# Patient Record
Sex: Female | Born: 1970
Health system: Southern US, Community
[De-identification: ages and names within clinical notes are randomized; demographics above are authoritative.]

---

## 2012-06-05 ENCOUNTER — Other Ambulatory Visit (HOSPITAL_COMMUNITY)
Admission: RE | Admit: 2012-06-05 | Discharge: 2012-06-05 | Disposition: A | Discharge status: Home / Self Care | Payer: 59 | Source: Ambulatory Visit | Attending: Obstetrics & Gynecology | Admitting: Obstetrics & Gynecology

## 2012-06-05 DIAGNOSIS — Z113 Encounter for screening for infections with a predominantly sexual mode of transmission: Secondary | ICD-10-CM | POA: Insufficient documentation

## 2012-06-05 DIAGNOSIS — Z124 Encounter for screening for malignant neoplasm of cervix: Secondary | ICD-10-CM | POA: Insufficient documentation

## 2012-06-05 DIAGNOSIS — Z1151 Encounter for screening for human papillomavirus (HPV): Secondary | ICD-10-CM | POA: Insufficient documentation

## 2017-08-21 ENCOUNTER — Encounter: Payer: Self-pay | Admitting: Podiatry

## 2017-08-21 ENCOUNTER — Ambulatory Visit (INDEPENDENT_AMBULATORY_CARE_PROVIDER_SITE_OTHER): Payer: 59 | Admitting: Podiatry

## 2017-08-21 VITALS — BP 143/94 | HR 80 | Ht 62.0 in | Wt 125.0 lb

## 2017-08-21 DIAGNOSIS — M6701 Short Achilles tendon (acquired), right ankle: Secondary | ICD-10-CM

## 2017-08-21 DIAGNOSIS — M722 Plantar fascial fibromatosis: Secondary | ICD-10-CM | POA: Diagnosis not present

## 2017-08-21 DIAGNOSIS — M21969 Unspecified acquired deformity of unspecified lower leg: Secondary | ICD-10-CM

## 2017-08-21 DIAGNOSIS — M79672 Pain in left foot: Secondary | ICD-10-CM | POA: Diagnosis not present

## 2017-08-21 NOTE — Patient Instructions (Signed)
Seen for pain in right heel. Noted of weak first metatarsal bone that is lowering the arch height. Reviewed findings and available treatment options. Both feet casted for orthotics. Return if needed injection.

## 2017-08-21 NOTE — Progress Notes (Signed)
SUBJECTIVE: 46 y.o. year old female presents complaining of left heel pain that started about a year ago. Was taking dance class and had worn uncomfortable shoes prior to developing this pain. 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  Review of Systems  Constitutional: Negative.   HENT: Negative.   Eyes: Negative.   Respiratory: Negative.   Cardiovascular: Negative.   Gastrointestinal: Negative.   Genitourinary: Negative.   Musculoskeletal: Negative.   Skin: Negative.   Neurological: Negative.   Psychiatric/Behavioral: Negative.    OBJECTIVE: DERMATOLOGIC EXAMINATION: No abnormal findings.  VASCULAR EXAMINATION OF LOWER LIMBS: All pedal pulses are palpable with normal pulsation.  Temperature gradient from tibial crest to dorsum of foot is within normal bilateral.  NEUROLOGIC EXAMINATION OF THE LOWER LIMBS: All epicritic and tactile sensations grossly intact. Sharp and Dull discriminatory sensations at the plantar ball of hallux is intact bilateral.   MUSCULOSKELETAL EXAMINATION: Positive for elevated first ray bilateral with dorsal bunion on right foot. Pain in right plantar at about medial calcaneal tuberosity area. Tight Achilles tendon right.  Radiology: Long first metatarsal on right. Rectus foot with digital contracture 4th and 5th bilateral R>L. Elevated first ray bilateral with good arch formation.  Dorsal spur at the head of the first metatarsal R>L. No calcaneal spur noted.  ASSESSMENT: Plantar fasciitis left. Metatarsus primus elevatus bilateral. Dorsal bunion right. Achilles tendon contracture right.  PLAN: Reviewed findings and available treatment options, NSAIA, injection, stretch exercise, orthotics, proper shoe gear. Both feet casted for orthotics. Reviewed stretch exercise for right Achilles tendon to practice daily. Return if needed injection.

## 2017-10-28 ENCOUNTER — Ambulatory Visit: Payer: 59 | Admitting: Podiatry

## 2017-10-28 ENCOUNTER — Encounter: Payer: Self-pay | Admitting: Podiatry

## 2017-10-28 DIAGNOSIS — M722 Plantar fascial fibromatosis: Secondary | ICD-10-CM

## 2017-10-28 NOTE — Progress Notes (Signed)
1 month follow up on left heel pain. Still having pain at plantar lateral heel. Inserts are helping. Making her to walk better. But the pain is still present. Different pain now.  May further benefit from lace up tennis shoes, raised up heel, metatarsal binder, Advils, cortisone injection, and also possible surgical options. Metatarsal binder dispensed with instruction. Patient will take 400 mg Ibuprofen and taper down weekly. Return as needed.

## 2017-10-28 NOTE — Patient Instructions (Addendum)
1 month follow up on left heel pain. Still having pain at plantar lateral heel. May further benefit from lace up tennis shoes, raised up heel, metatarsal binder, Advils, cortisone injection, and also possible surgical options. Return as needed.

## 2019-05-08 DIAGNOSIS — N939 Abnormal uterine and vaginal bleeding, unspecified: Secondary | ICD-10-CM | POA: Diagnosis not present

## 2019-05-21 DIAGNOSIS — N939 Abnormal uterine and vaginal bleeding, unspecified: Secondary | ICD-10-CM | POA: Diagnosis not present

## 2019-07-03 DIAGNOSIS — Z209 Contact with and (suspected) exposure to unspecified communicable disease: Secondary | ICD-10-CM | POA: Diagnosis not present

## 2019-07-03 DIAGNOSIS — N939 Abnormal uterine and vaginal bleeding, unspecified: Secondary | ICD-10-CM | POA: Diagnosis not present

## 2019-07-03 DIAGNOSIS — N840 Polyp of corpus uteri: Secondary | ICD-10-CM | POA: Diagnosis not present

## 2019-11-02 DIAGNOSIS — Z6827 Body mass index (BMI) 27.0-27.9, adult: Secondary | ICD-10-CM | POA: Diagnosis not present

## 2019-11-02 DIAGNOSIS — Z01419 Encounter for gynecological examination (general) (routine) without abnormal findings: Secondary | ICD-10-CM | POA: Diagnosis not present

## 2019-11-02 DIAGNOSIS — Z1231 Encounter for screening mammogram for malignant neoplasm of breast: Secondary | ICD-10-CM | POA: Diagnosis not present

## 2019-12-03 DIAGNOSIS — K635 Polyp of colon: Secondary | ICD-10-CM | POA: Diagnosis not present

## 2019-12-03 DIAGNOSIS — D125 Benign neoplasm of sigmoid colon: Secondary | ICD-10-CM | POA: Diagnosis not present

## 2019-12-03 DIAGNOSIS — Z1211 Encounter for screening for malignant neoplasm of colon: Secondary | ICD-10-CM | POA: Diagnosis not present

## 2020-05-25 ENCOUNTER — Ambulatory Visit (INDEPENDENT_AMBULATORY_CARE_PROVIDER_SITE_OTHER): Payer: 59

## 2020-05-25 ENCOUNTER — Encounter: Payer: Self-pay | Admitting: Family Medicine

## 2020-05-25 ENCOUNTER — Other Ambulatory Visit: Payer: Self-pay

## 2020-05-25 ENCOUNTER — Ambulatory Visit: Payer: Self-pay

## 2020-05-25 ENCOUNTER — Ambulatory Visit: Payer: 59 | Admitting: Family Medicine

## 2020-05-25 VITALS — BP 124/82 | HR 81 | Ht 62.0 in | Wt 148.0 lb

## 2020-05-25 DIAGNOSIS — M75111 Incomplete rotator cuff tear or rupture of right shoulder, not specified as traumatic: Secondary | ICD-10-CM | POA: Diagnosis not present

## 2020-05-25 DIAGNOSIS — M79601 Pain in right arm: Secondary | ICD-10-CM

## 2020-05-25 DIAGNOSIS — M25511 Pain in right shoulder: Secondary | ICD-10-CM | POA: Diagnosis not present

## 2020-05-25 MED ORDER — NITROGLYCERIN 0.2 MG/HR TD PT24
MEDICATED_PATCH | TRANSDERMAL | 1 refills | Status: DC
Start: 2020-05-25 — End: 2022-06-13

## 2020-05-25 NOTE — Patient Instructions (Signed)
Thank you for coming in today. I think you have a partial rotator cuff tear.  Plan for PT and nitro patches.  Nitroglycerin Protocol   Apply 1/4 nitroglycerin patch to affected area daily.  Change position of patch within the affected area every 24 hours.  You may experience a headache during the first 1-2 weeks of using the patch, these should subside.  If you experience headaches after beginning nitroglycerin patch treatment, you may take your preferred over the counter pain reliever.  Another side effect of the nitroglycerin patch is skin irritation or rash related to patch adhesive.  Please notify our office if you develop more severe headaches or rash, and stop the patch.  Tendon healing with nitroglycerin patch may require 12 to 24 weeks depending on the extent of injury.  Men should not use if taking Viagra, Cialis, or Levitra.   Do not use if you have migraines or rosacea.    Recheck in 4 weeks.  Return or contact me sooner if needed.    Rotator Cuff Tear  A rotator cuff tear is a partial or complete tear of the cord-like bands (tendons) that connect muscle to bone in the rotator cuff. The rotator cuff is a group of muscles and tendons that surround the shoulder joint and keep the upper arm bone (humerus) in the shoulder socket. The tear can occur suddenly (acute tear) or can develop over a long period of time (chronic tear). What are the causes? Acute tears may be caused by:  A fall, especially on an outstretched arm.  Lifting very heavy objects with a jerking motion. Chronic tears may be caused by overuse of the muscles. This may happen in sports, physical work, or activities in which your arm repeatedly moves over your head. What increases the risk? This condition is more likely to occur in:  Athletes and workers who frequently use their shoulder or reach over their heads. This may include activities such as: ? Tennis. ? Baseball and softball. ? Swimming and  rowing. ? Weightlifting. ? Holiday representative work. ? Painting.  People who smoke.  Older people who have arthritis or poor blood supply. These can make the muscles and tendons weaker. What are the signs or symptoms? Symptoms of this condition depend on the type and severity of the injury:  An acute tear may include a sudden tearing feeling, followed by severe pain that goes from your upper shoulder, down your arm, and toward your elbow.  A chronic tear includes a gradual weakness and decreased shoulder motion as the pain gets worse. The pain is usually worse at night. Both types may have symptoms such as:  Pain that spreads (radiates) from the shoulder to the upper arm.  Swelling and tenderness in front of the shoulder.  Decreased range of motion.  Pain when: ? Reaching, pulling, or lifting the arm above the head. ? Lowering the arm from above the head.  Not being able to raise your arm out to the side.  Difficulty placing the arm behind your back. How is this diagnosed? This condition is diagnosed with a medical history and physical exam. Imaging tests may also be done, including:  X-rays.  MRI.  Ultrasound.  CT or MR arthrogram. During this test, a contrast material is injected into your shoulder and then images are taken. How is this treated? Treatment for this condition depends on the type and severity of the condition. In less severe cases, treatment may include:  Rest. This may be done with  a sling that holds the shoulder still (immobilization). Your health care provider may also recommend avoiding activities that involve lifting your arm over your head.  Icing the shoulder.  Anti-inflammatory medicines, such as aspirin or ibuprofen.  Strengthening and stretching exercises. Your health care provider may recommend specific exercises to improve your range of motion and strengthen your shoulder. In more severe cases, treatment may include:  Physical  therapy.  Steroid injections.  Surgery. Follow these instructions at home: Managing pain, stiffness, and swelling  If directed, put ice on the injured area. ? If you have a removable sling, remove it as told by your health care provider. ? Put ice in a plastic bag. ? Place a towel between your skin and the bag. ? Leave the ice on for 20 minutes, 2-3 times a day.  Raise (elevate) the injured area above the level of your heart while you are lying down.  Find a comfortable sleeping position or sleep on a recliner, if available.  Move your fingers often to avoid stiffness and to lessen swelling.  Once the swelling has gone down, your health care provider may direct you to apply heat to relax the muscles. Use the heat source that your health care provider recommends, such as a moist heat pack or a heating pad. ? Place a towel between your skin and the heat source. ? Leave the heat on for 20-30 minutes. ? Remove the heat if your skin turns bright red. This is especially important if you are unable to feel pain, heat, or cold. You may have a greater risk of getting burned. If you have a sling:  Wear the sling as told by your health care provider. Remove it only as told by your health care provider.  Loosen the sling if your fingers tingle, become numb, or turn cold and blue.  Keep the sling clean.  If the sling is not waterproof: ? Do not let it get wet. ? Cover it with a watertight covering when you take a bath or a shower. Driving  Do not drive or use heavy machinery while taking prescription pain medicine.  Ask your health care provider when it is safe to drive if you have a sling on your arm. Activity  Rest your shoulder as told by your health care provider.  Return to your normal activities as told by your health care provider. Ask your health care provider what activities are safe for you.  Do any exercises or stretches as told by your health care provider. General  instructions  Do not use any products that contain nicotine or tobacco, such as cigarettes and e-cigarettes. If you need help quitting, ask your health care provider.  Take over-the-counter and prescription medicines only as told by your health care provider.  Keep all follow-up visits as told by your health care provider. This is important. Contact a health care provider if:  Your pain gets worse.  You have new pain in your arm, hands, or fingers.  Medicine does not help your pain. Get help right away if:  Your arm, hand, or fingers are numb or tingling.  Your arm, hand, or fingers are swollen or painful or they turn white or blue.  Your hand or fingers on your injured arm are colder than your other hand. Summary  A rotator cuff tear is a partial or complete tear of the cord-like bands (tendons) that connect muscle to bone in the rotator cuff.  The tear can occur suddenly (acute tear)  or can develop over a long period of time (chronic tear).  Treatment generally includes rest, anti-inflammatory medicines, and icing. In some cases, physical therapy and steroid injections may be needed. In severe cases, surgery may be needed. This information is not intended to replace advice given to you by your health care provider. Make sure you discuss any questions you have with your health care provider. Document Revised: 09/20/2017 Document Reviewed: 12/24/2016 Elsevier Patient Education  [place].

## 2020-05-25 NOTE — Progress Notes (Signed)
Subjective:    CC: Arm pain  I, Jessica Gilmore, am serving as a Neurosurgeon for Dr. Clementeen Graham.  HPI: Pt is a 49 y/o female presenting w/ c/o R arm pain Limiting ROM unable to lift arm not sure if it is in her shoulder started a couple months ago. States pain is better from 2 months ago but not better.  She cannot recall any injury.  However she notes that her pain started after Pilates.  She notes the pain is quite severe initially with significant difficulty with overhead motion which has improved a bit.  She denies any radiating pain weakness or numbness fevers or chills.  Not tried much treatment yet.  Radiating pain: pain un upper arm Neck pain:no  UE numbness/tingling: no  UE weakness:no Aggravating factors: lifting arm  Treatments tried: nothing   Pertinent review of Systems: No fevers or chills  Relevant historical information: Plantar fasciitis   Objective:    Vitals:   05/25/20 1547  BP: 124/82  Pulse: 81  SpO2: 97%   General: Well Developed, well nourished, and in no acute distress.   MSK: C-spine normal-appearing normal motion. Right shoulder normal-appearing Normal motion pain with abduction. Strength 4/5 abduction 5/5 external and internal rotation. Positive empty can test. Positive Hawkins and Neer's test. Negative Yergason's and speeds test.  Pulses cap refill and sensation are intact bilateral upper extremities.  Lab and Radiology Results  X-ray images right shoulder obtained today personally and independently reviewed No acute fractures mild AC DJD. Await formal radiology review  Diagnostic Limited MSK Ultrasound of: Right shoulder Biceps tendon intact in bicipital groove.  Hypoechoic fluid structure superficial to biceps tendon proximally. Subscapularis tendon looks to be intact with hypoechoic fluid seen with biceps tendon testing distal to insertion footplate of the subscapularis. Supraspinatus tendon has hypoechoic fluid collecting mid  substance of tendon indicating linear split tear.  No retraction visible. Infraspinatus tendon normal-appearing AC joint narrowed degenerative. Impression: Mid substance incomplete tear supraspinatus tendon    Impression and Recommendations:    Assessment and Plan: 49 y.o. female with right shoulder pain due to partial-thickness supraspinatus tear.  Plan for physical therapy nitroglycerin patch protocol and recheck in about a month.Marland Kitchen  PDMP not reviewed this encounter. Orders Placed This Encounter  Procedures  . [place] LIMITED JOINT SPACE STRUCTURES UP RIGHT    Standing Status:   Future    Number of Occurrences:   1    Standing Expiration Date:   05/25/2021    Order Specific Question:   Reason for Exam (SYMPTOM  OR DIAGNOSIS REQUIRED)    Answer:   Right arm pain    Order Specific Question:   Preferred imaging location?    Answer:   Adult nurse [facility]  . DG Shoulder Right    Standing Status:   Future    Number of Occurrences:   1    Standing Expiration Date:   05/25/2021    Order Specific Question:   Reason for Exam (SYMPTOM  OR DIAGNOSIS REQUIRED)    Answer:   eval shoulder pain    Order Specific Question:   Is patient pregnant?    Answer:   No    Order Specific Question:   Preferred imaging location?    Answer:   [name]    Order Specific Question:   Radiology Contrast Protocol - do NOT remove file path    Answer:   [email or web address]  . Ambulatory referral to Physical Therapy  Referral Priority:   Routine    Referral Type:   Physical Medicine    Referral Reason:   Specialty Services Required    Requested Specialty:   Physical Therapy   Meds ordered this encounter  Medications  . nitroGLYCERIN (NITRODUR - DOSED IN MG/24 HR) 0.2 mg/hr patch    Sig: Apply 1/4 patch daily to tendon for tendonitis.    Dispense:  30 patch    Refill:  1    Discussed warning signs or symptoms. Please see discharge instructions.  Patient expresses understanding.   The above documentation has been reviewed and is accurate and complete Clementeen Graham, M.D.

## 2020-05-26 NOTE — Progress Notes (Signed)
X-ray looks pretty normal to radiology.

## 2020-06-21 ENCOUNTER — Ambulatory Visit (INDEPENDENT_AMBULATORY_CARE_PROVIDER_SITE_OTHER): Payer: 59 | Admitting: Physical Therapy

## 2020-06-21 ENCOUNTER — Other Ambulatory Visit: Payer: Self-pay

## 2020-06-21 ENCOUNTER — Encounter: Payer: Self-pay | Admitting: Physical Therapy

## 2020-06-21 DIAGNOSIS — R600 Localized edema: Secondary | ICD-10-CM

## 2020-06-21 DIAGNOSIS — M25511 Pain in right shoulder: Secondary | ICD-10-CM | POA: Diagnosis not present

## 2020-06-21 DIAGNOSIS — M6281 Muscle weakness (generalized): Secondary | ICD-10-CM | POA: Diagnosis not present

## 2020-06-21 NOTE — Patient Instructions (Signed)
Access Code: [email or web address] URL: [email or web address] Date: 06/21/2020 Prepared by: Ivery Quale  Exercises Shoulder Flexion Wall Slide with Towel - 2 x daily - 6 x weekly - 1 sets - 10 reps Standing Shoulder Abduction Slides at Wall - 2 x daily - 6 x weekly - 1 sets - 10 reps Shoulder Extension with Resistance - 1 x daily - 7 x weekly - 2 sets - 10-15 reps Standing Shoulder Row with Anchored Resistance - 1 x daily - 7 x weekly - 2 sets - 10-15 reps - 5 hold Standing Shoulder Horizontal Abduction with Resistance - 1 x daily - 7 x weekly - 2 sets - 10-15 reps - 5 hold Shoulder External Rotation and Scapular Retraction with Resistance - 1 x daily - 7 x weekly - 2 sets - 10-15 reps

## 2020-06-21 NOTE — Therapy (Addendum)
[facility] [address] [place], [place], [address] Phone: [phone]   Fax:  [phone]  Physical Therapy Evaluation/Discharge addendum PHYSICAL THERAPY DISCHARGE SUMMARY  Visits from Start of Care: 1  Current functional level related to goals / functional outcomes: See below   Remaining deficits: See below   Education / Equipment: HEP Plan: Patient agrees to discharge.  Patient goals were not met. Patient is being discharged due to the patient's request.  ?????   [place] office staff notifies PT that patient requests discharge due to feeling better. Elsie Ra, PT, DPT 06/28/20 8:28 AM     Patient Details  Name: Jessica Gilmore MRN: [id number] Date of Birth: 1971-04-12 Referring Provider (PT): Noelle Penner   Encounter Date: 06/21/2020   PT End of Session - 06/21/20 1886    Visit Number 1    Number of Visits 12    Date for PT Re-Evaluation 08/02/20    PT Start Time 0804    PT Stop Time 0840    PT Time Calculation (min) 36 min    Activity Tolerance Patient tolerated treatment well    Behavior During Therapy [facility] for tasks assessed/performed           History reviewed. No pertinent past medical history.  History reviewed. No pertinent surgical history.  There were no vitals filed for this visit.    Subjective Assessment - 06/21/20 0809    Subjective Pt is a 49 y/o female presenting w/ c/o R arm pain Limiting ROM unable to lift arm not sure if it is in her shoulder started a couple months ago. States pain is better from 2 months ago but not better.  She cannot recall any injury.  However she notes that her pain started after Pilates.  She notes the pain is quite severe initially with significant difficulty with overhead motion which has improved a bit.  She denies any radiating pain weakness or numbness fevers or chills.  Not tried much treatment yet. The shoulder is improving some but still having difficulty with reaching up or  out. No pain at rest.    Pertinent History no significant PMH    Limitations Lifting;House hold activities    Diagnostic tests Rt should XR show mild AC DJD. MSK US showsMid substance incomplete partial thickness tear supraspinatus tendon    Patient Stated Goals reduce pain, no what exercises to do to make sure it heals right    Currently in Pain? Yes    Pain Score 7     Pain Location Shoulder    Pain Orientation Right    Pain Descriptors / Indicators Sharp    Pain Type Chronic pain    Pain Radiating Towards lateral Rt arm, denies any N/T    Pain Onset More than a month ago    Pain Frequency Intermittent    Aggravating Factors  lifting and reaching out or up or overhead    Pain Relieving Factors rest              [facility] PT Assessment - 06/21/20 0001      Assessment   Medical Diagnosis Rt shoulder pain, partial supraspinatus tear    Referring Provider (PT) Tommi Rumps, E    Onset Date/Surgical Date --   4 month onset of pain   Hand Dominance Right    Next MD Visit PRN    Prior Therapy none      Precautions   Precautions None      Balance Screen  Has the patient fallen in the past 6 months No    Has the patient had a decrease in activity level because of a fear of falling?  No    Is the patient reluctant to leave their home because of a fear of falling?  No      Home Ecologist residence      Prior Function   Level of Independence Independent    Leisure pilates      Cognition   Overall Cognitive Status Within Functional Limits for tasks assessed      ROM / Strength   AROM / PROM / Strength AROM;PROM;Strength      AROM   AROM Assessment Site Shoulder    Right/Left Shoulder Right    Right Shoulder Flexion 160 Degrees    Right Shoulder ABduction --   WNL abd, but in scaption limited to 100 deg before pain   Right Shoulder Internal Rotation --   L4 behind back   Right Shoulder External Rotation --   WNL     PROM   Overall PROM Comments  WNL with good GH mobility noted      Strength   Strength Assessment Site Shoulder    Right/Left Shoulder Right;Left    Right Shoulder Flexion 4+/5    Right Shoulder ABduction 4+/5    Right Shoulder Internal Rotation 5/5    Right Shoulder External Rotation 4/5    Left Shoulder Flexion 5/5    Left Shoulder ABduction 5/5    Left Shoulder Internal Rotation 5/5    Left Shoulder External Rotation 4+/5                      Objective measurements completed on examination: See above findings.       [facility] Adult PT Treatment/Exercise - 06/21/20 0001      Modalities   Modalities Cryotherapy      Cryotherapy   Number Minutes Cryotherapy 10 Minutes    Cryotherapy Location Shoulder    Type of Cryotherapy Ice pack                  PT Education - 06/21/20 0938    Education Details HEP, POC    Person(s) Educated Patient    Methods Explanation;Demonstration;Verbal cues;Handout    Comprehension Verbalized understanding;Need further instruction               PT Long Term Goals - 06/21/20 0947      PT LONG TERM GOAL #1   Title Pt will be I and compliant with HEP.    Time 6    Period Weeks    Status New    Target Date 08/02/20      PT LONG TERM GOAL #2   Title Pt will improve Rt shoulder strength to 5/5    Time 6    Period Weeks    Status New      PT LONG TERM GOAL #3   Title Pt will improve Rt shoudler ROM to WNL    Time 6    Period Weeks    Status New      PT LONG TERM GOAL #4   Title Pt will reduce pain to no more than 3/10 with normal acitivity and reaching overhead    Status New                  Plan - 06/21/20 0939    Clinical Impression Statement Pt  presents with Rt shouler pain and weakness due to Mid substance incomplete partial thickness tear supraspinatus tendon and AC joint DJD. She will benefit from skilled PT to address her functional defiicts in ROM, reaching, weakness, and activity tolerance.    Examination-Activity  Limitations Carry;Lift;Reach Overhead    Examination-Participation Restrictions Cleaning;Driving;Laundry    Stability/Clinical Decision Making Stable/Uncomplicated    Clinical Decision Making Low    Rehab Potential Good    PT Frequency 2x / week   1-2   PT Duration 6 weeks   4-6   PT Treatment/Interventions ADLs/Self Care Home Management;Cryotherapy;Electrical Stimulation;Iontophoresis 79m/ml Dexamethasone;Moist Heat;Ultrasound;Therapeutic activities;Therapeutic exercise;Neuromuscular re-education;Manual techniques;Passive range of motion;Dry needling;Joint Manipulations;Taping;Vasopneumatic Device    PT Next Visit Plan review and update HEP PRN, modaltaties PRN, has atena so if use ionto do not charge. Needs Rt shoulder RTC strength    PT Home Exercise Plan Access Code: [email or web address]          Patient will benefit from skilled therapeutic intervention in order to improve the following deficits and impairments:  Decreased activity tolerance, Decreased range of motion, Decreased strength, Increased edema, Increased muscle spasms, Impaired UE functional use, Pain  Visit Diagnosis: Acute pain of right shoulder  Muscle weakness (generalized)  Localized edema     Problem List Patient Active Problem List   Diagnosis Date Noted  . Foot pain, left 08/21/2017  . Plantar fasciitis of left foot 08/21/2017    BDebbe Odea,PT,DPT 06/21/2020, 9:50 AM  [facility] [address] [place] [address]Phone: [phone]  Fax:  [phone] Name: YMarin WisnerMRN: [id number] of Birth: 607-21-1972

## 2020-07-08 ENCOUNTER — Encounter: Payer: 59 | Admitting: Physical Therapy

## 2020-07-12 ENCOUNTER — Encounter: Payer: 59 | Admitting: Rehabilitative and Restorative Service Providers"

## 2021-01-03 DIAGNOSIS — Z13220 Encounter for screening for lipoid disorders: Secondary | ICD-10-CM | POA: Diagnosis not present

## 2021-01-03 DIAGNOSIS — I10 Essential (primary) hypertension: Secondary | ICD-10-CM | POA: Diagnosis not present

## 2021-01-03 DIAGNOSIS — Z79899 Other long term (current) drug therapy: Secondary | ICD-10-CM | POA: Diagnosis not present

## 2021-01-03 DIAGNOSIS — R946 Abnormal results of thyroid function studies: Secondary | ICD-10-CM | POA: Diagnosis not present

## 2021-01-04 IMAGING — DX DG SHOULDER 2+V*R*
3 series · 3 of 3 positions shown · non-contrast
Comparison: None.

CLINICAL DATA: Shoulder pain

EXAM:
RIGHT SHOULDER - 2+ VIEW

[shoulder ap (1 of 2)]
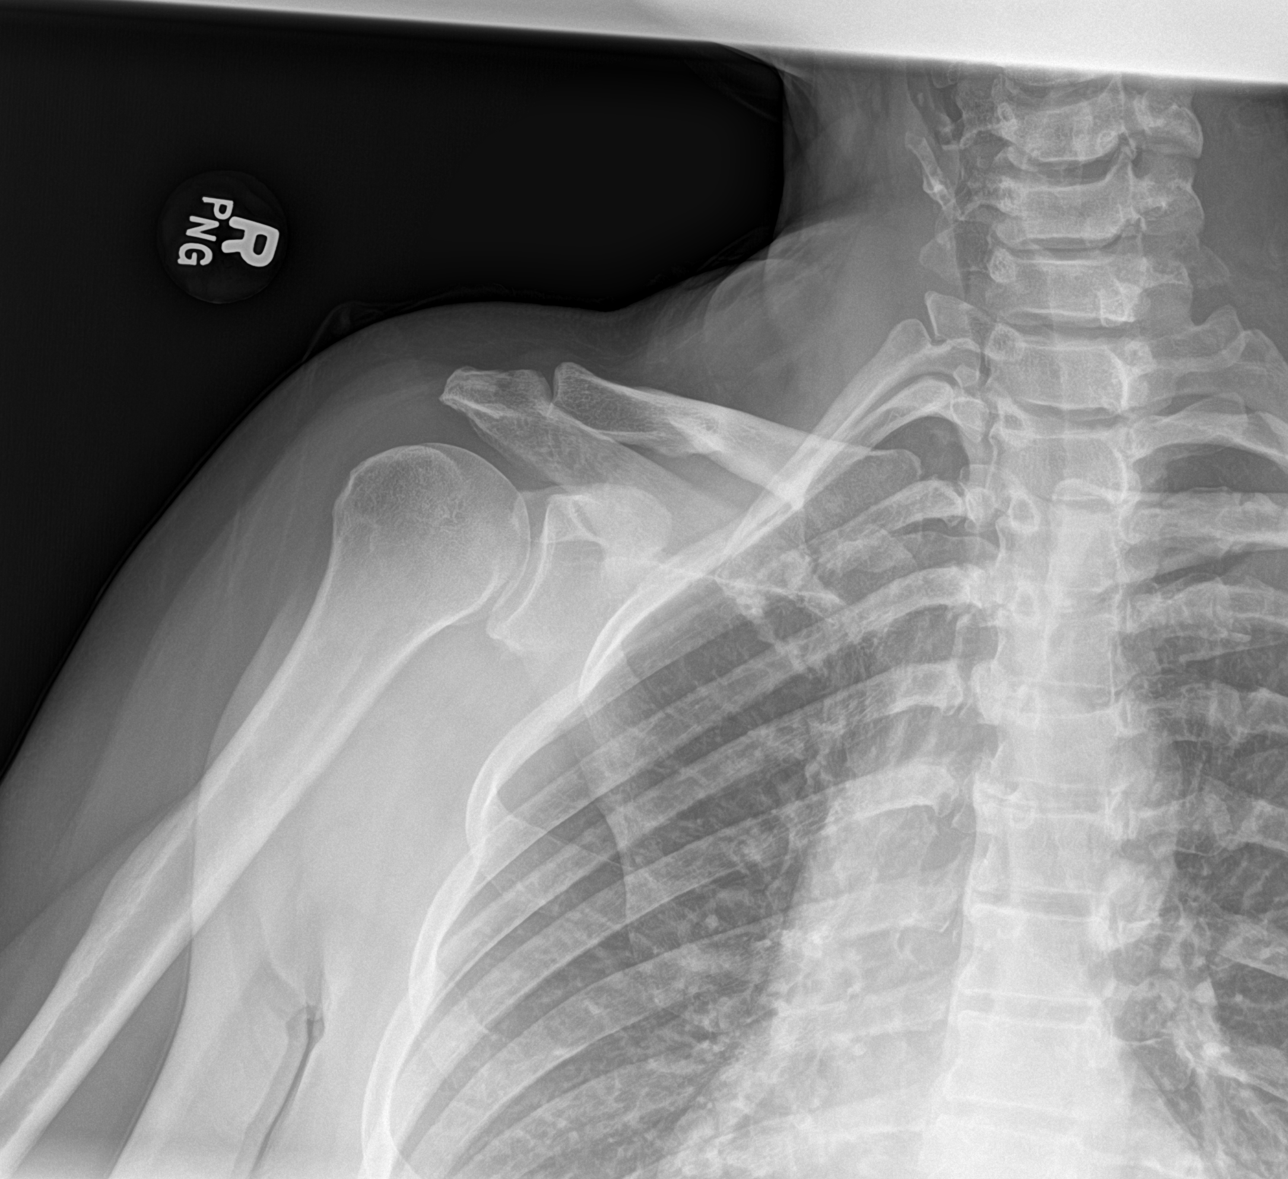

[shoulder ap (2 of 2)]
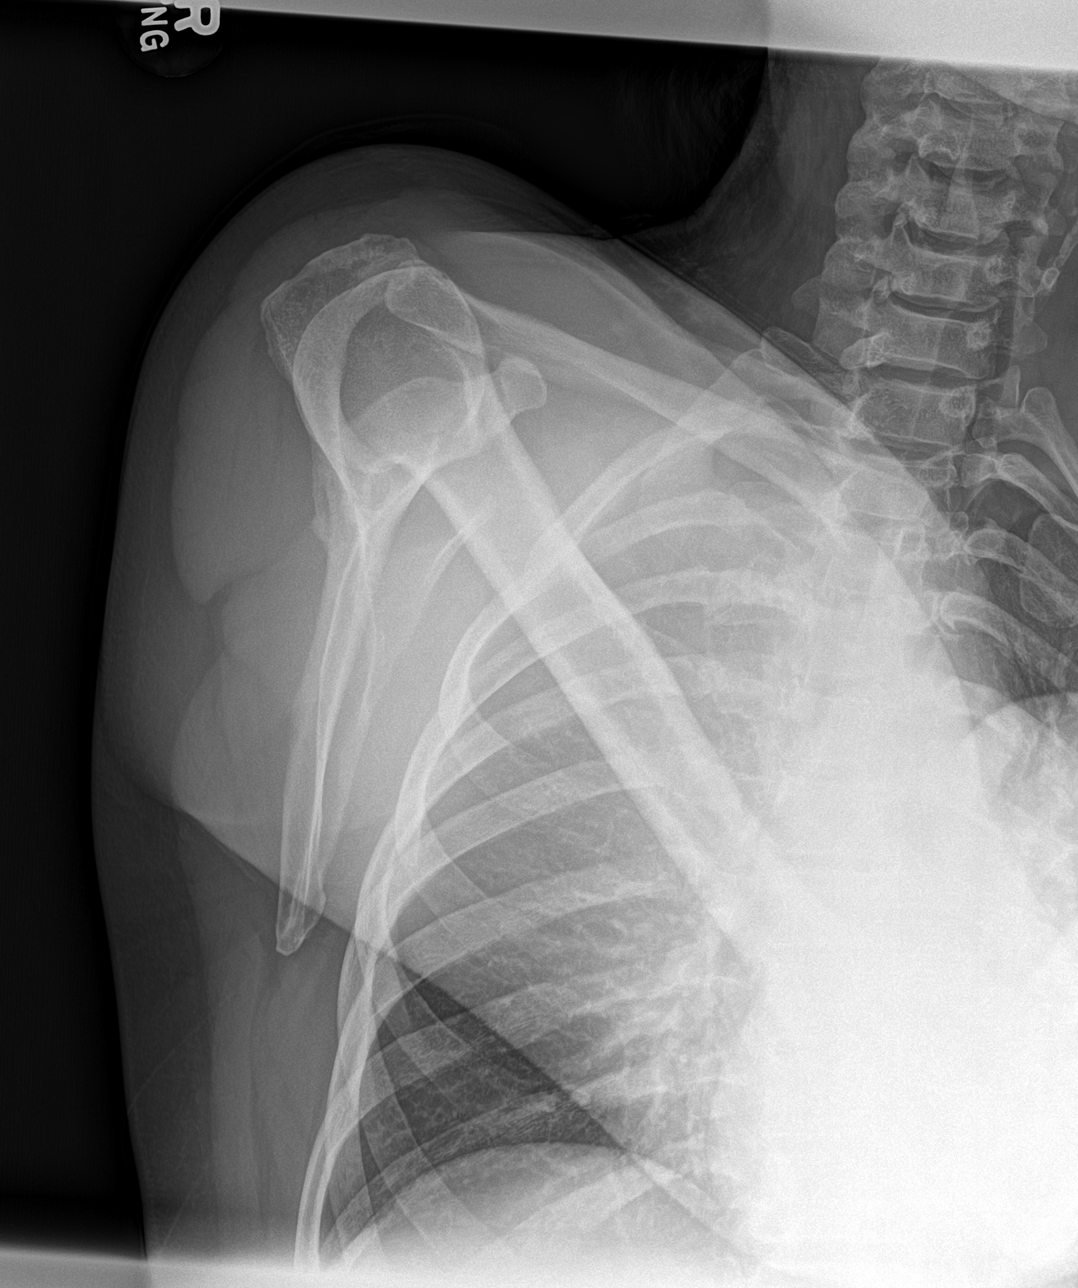

[shoulder axial]
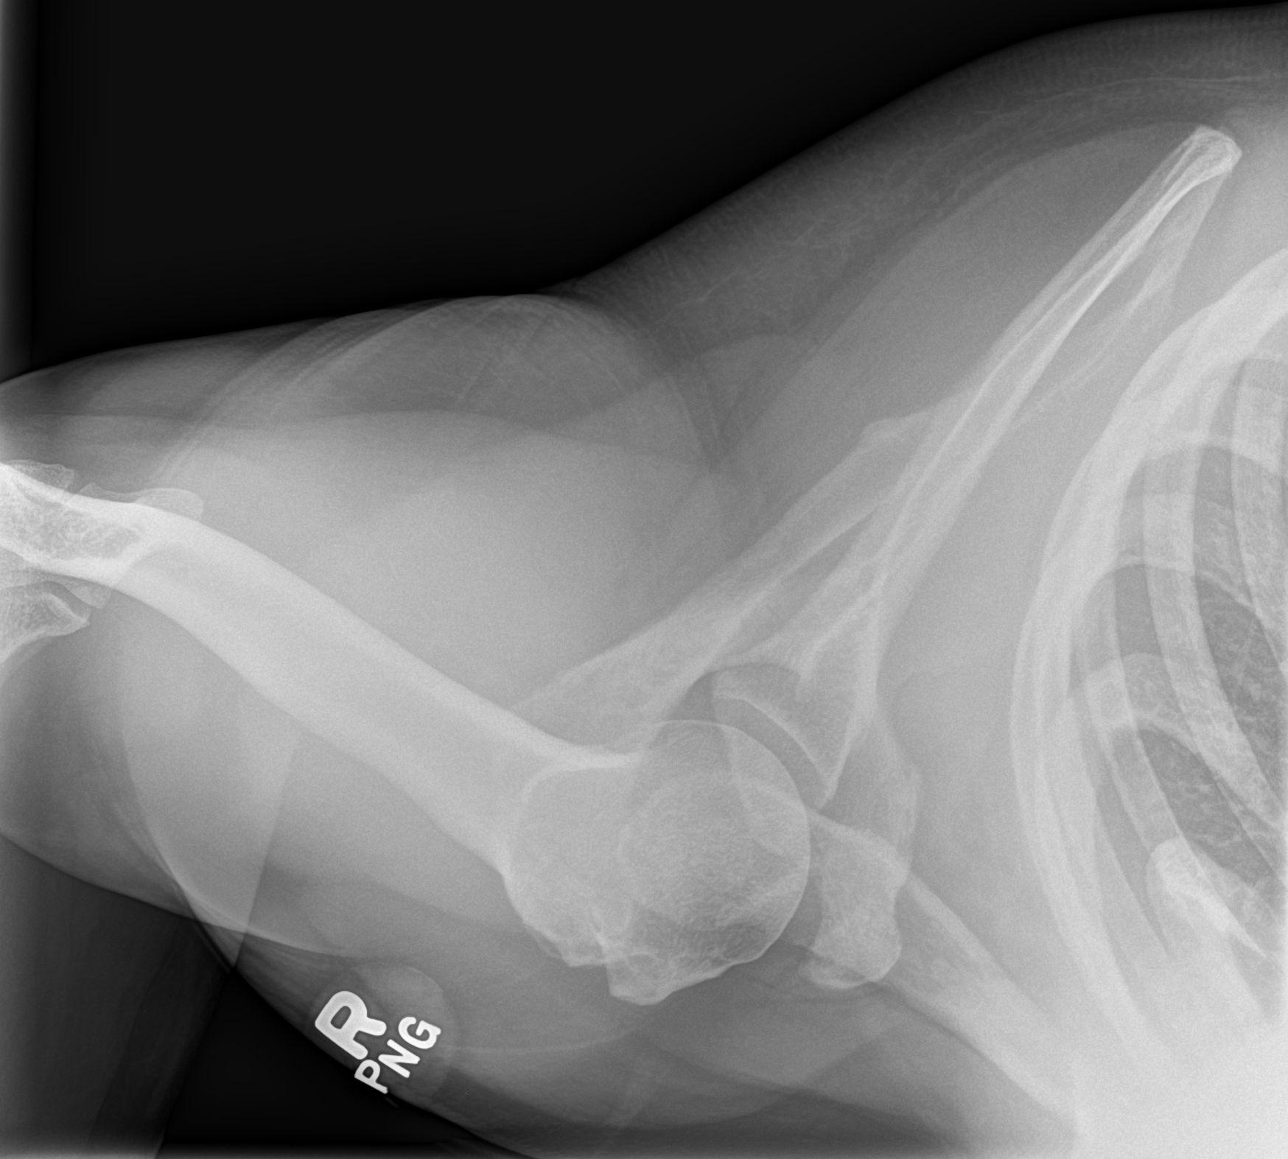

[3 of 3 positions shown; findings below may reference images not displayed]

FINDINGS: There is no evidence of fracture or dislocation. There is no
evidence of arthropathy or other focal bone abnormality. Soft
tissues are unremarkable.
IMPRESSION: Negative.

## 2021-01-17 DIAGNOSIS — Z13220 Encounter for screening for lipoid disorders: Secondary | ICD-10-CM | POA: Diagnosis not present

## 2021-01-17 DIAGNOSIS — E7800 Pure hypercholesterolemia, unspecified: Secondary | ICD-10-CM | POA: Diagnosis not present

## 2021-01-17 DIAGNOSIS — I10 Essential (primary) hypertension: Secondary | ICD-10-CM | POA: Diagnosis not present

## 2021-01-17 DIAGNOSIS — Z79899 Other long term (current) drug therapy: Secondary | ICD-10-CM | POA: Diagnosis not present

## 2021-03-13 DIAGNOSIS — Z6827 Body mass index (BMI) 27.0-27.9, adult: Secondary | ICD-10-CM | POA: Diagnosis not present

## 2021-03-13 DIAGNOSIS — Z1231 Encounter for screening mammogram for malignant neoplasm of breast: Secondary | ICD-10-CM | POA: Diagnosis not present

## 2021-03-13 DIAGNOSIS — Z01419 Encounter for gynecological examination (general) (routine) without abnormal findings: Secondary | ICD-10-CM | POA: Diagnosis not present

## 2022-01-19 DIAGNOSIS — R7309 Other abnormal glucose: Secondary | ICD-10-CM | POA: Diagnosis not present

## 2022-01-19 DIAGNOSIS — Z13220 Encounter for screening for lipoid disorders: Secondary | ICD-10-CM | POA: Diagnosis not present

## 2022-01-19 DIAGNOSIS — R946 Abnormal results of thyroid function studies: Secondary | ICD-10-CM | POA: Diagnosis not present

## 2022-01-19 DIAGNOSIS — I10 Essential (primary) hypertension: Secondary | ICD-10-CM | POA: Diagnosis not present

## 2022-02-22 DIAGNOSIS — E7800 Pure hypercholesterolemia, unspecified: Secondary | ICD-10-CM | POA: Diagnosis not present

## 2022-02-22 DIAGNOSIS — L989 Disorder of the skin and subcutaneous tissue, unspecified: Secondary | ICD-10-CM | POA: Diagnosis not present

## 2022-02-22 DIAGNOSIS — Z0000 Encounter for general adult medical examination without abnormal findings: Secondary | ICD-10-CM | POA: Diagnosis not present

## 2022-02-22 DIAGNOSIS — R7309 Other abnormal glucose: Secondary | ICD-10-CM | POA: Diagnosis not present

## 2022-02-22 DIAGNOSIS — R946 Abnormal results of thyroid function studies: Secondary | ICD-10-CM | POA: Diagnosis not present

## 2022-04-10 DIAGNOSIS — N959 Unspecified menopausal and perimenopausal disorder: Secondary | ICD-10-CM | POA: Diagnosis not present

## 2022-04-10 DIAGNOSIS — Z6827 Body mass index (BMI) 27.0-27.9, adult: Secondary | ICD-10-CM | POA: Diagnosis not present

## 2022-04-10 DIAGNOSIS — Z1231 Encounter for screening mammogram for malignant neoplasm of breast: Secondary | ICD-10-CM | POA: Diagnosis not present

## 2022-04-10 DIAGNOSIS — R8290 Unspecified abnormal findings in urine: Secondary | ICD-10-CM | POA: Diagnosis not present

## 2022-04-10 DIAGNOSIS — Z01419 Encounter for gynecological examination (general) (routine) without abnormal findings: Secondary | ICD-10-CM | POA: Diagnosis not present

## 2022-05-24 DIAGNOSIS — R29898 Other symptoms and signs involving the musculoskeletal system: Secondary | ICD-10-CM | POA: Diagnosis not present

## 2022-05-24 DIAGNOSIS — M79601 Pain in right arm: Secondary | ICD-10-CM | POA: Diagnosis not present

## 2022-05-24 DIAGNOSIS — M79602 Pain in left arm: Secondary | ICD-10-CM | POA: Diagnosis not present

## 2022-05-24 DIAGNOSIS — R200 Anesthesia of skin: Secondary | ICD-10-CM | POA: Diagnosis not present

## 2022-06-06 ENCOUNTER — Encounter (HOSPITAL_BASED_OUTPATIENT_CLINIC_OR_DEPARTMENT_OTHER): Payer: Self-pay

## 2022-06-06 ENCOUNTER — Emergency Department (HOSPITAL_BASED_OUTPATIENT_CLINIC_OR_DEPARTMENT_OTHER)
Admission: EM | Admit: 2022-06-06 | Discharge: 2022-06-06 | Disposition: A | Discharge status: Home / Self Care | Payer: 59 | Attending: Emergency Medicine | Admitting: Emergency Medicine

## 2022-06-06 ENCOUNTER — Emergency Department (HOSPITAL_BASED_OUTPATIENT_CLINIC_OR_DEPARTMENT_OTHER): Payer: 59 | Admitting: Radiology

## 2022-06-06 ENCOUNTER — Other Ambulatory Visit: Payer: Self-pay

## 2022-06-06 DIAGNOSIS — R079 Chest pain, unspecified: Secondary | ICD-10-CM | POA: Diagnosis not present

## 2022-06-06 DIAGNOSIS — I209 Angina pectoris, unspecified: Secondary | ICD-10-CM | POA: Insufficient documentation

## 2022-06-06 DIAGNOSIS — R0789 Other chest pain: Secondary | ICD-10-CM | POA: Diagnosis not present

## 2022-06-06 DIAGNOSIS — Z7982 Long term (current) use of aspirin: Secondary | ICD-10-CM | POA: Diagnosis not present

## 2022-06-06 LAB — CBC
HCT: 42 % (ref 36.0–46.0)
Hemoglobin: 14.2 g/dL (ref 12.0–15.0)
MCH: 31.8 pg (ref 26.0–34.0)
MCHC: 33.8 g/dL (ref 30.0–36.0)
MCV: 94.2 fL (ref 80.0–100.0)
Platelets: 265 10*3/uL (ref 150–400)
RBC: 4.46 MIL/uL (ref 3.87–5.11)
RDW: 13.5 % (ref 11.5–15.5)
WBC: 5 10*3/uL (ref 4.0–10.5)
nRBC: 0 % (ref 0.0–0.2)

## 2022-06-06 LAB — TROPONIN I (HIGH SENSITIVITY): Troponin I (High Sensitivity): 3 ng/L (ref <18)

## 2022-06-06 LAB — HEPATIC FUNCTION PANEL
ALT: 21 U/L (ref 0–44)
AST: 18 U/L (ref 15–41)
Albumin: 4.6 g/dL (ref 3.5–5.0)
Alkaline Phosphatase: 78 U/L (ref 38–126)
Bilirubin, Direct: <0.1 mg/dL (ref 0.0–0.2)
Total Bilirubin: 0.4 mg/dL (ref 0.3–1.2)
Total Protein: 7.7 g/dL (ref 6.5–8.1)

## 2022-06-06 LAB — BASIC METABOLIC PANEL
Anion gap: 8 (ref 5–15)
BUN: 11 mg/dL (ref 6–20)
CO2: 26 mmol/L (ref 22–32)
Calcium: 9.6 mg/dL (ref 8.9–10.3)
Chloride: 104 mmol/L (ref 98–111)
Creatinine, Ser: 0.71 mg/dL (ref 0.44–1.00)
GFR, Estimated: >60 mL/min (ref >60)
Glucose, Bld: 110 mg/dL — ABNORMAL HIGH (ref 70–99)
Potassium: 3.8 mmol/L (ref 3.5–5.1)
Sodium: 138 mmol/L (ref 135–145)

## 2022-06-06 LAB — HCG, SERUM, QUALITATIVE: Preg, Serum: NEGATIVE

## 2022-06-06 LAB — LIPASE, BLOOD: Lipase: 24 U/L (ref 11–51)

## 2022-06-06 MED ORDER — ASPIRIN 81 MG PO CHEW: 81.0000 mg | CHEWABLE_TABLET | Freq: Every day | ORAL | 0 refills | Status: DC

## 2022-06-06 NOTE — ED Triage Notes (Signed)
Patient here POV from Home.  Endorses CP and Back Pain associated with SOB that began Last PM that awakened the Patient.   Symptoms have Now Subsided. Lasted approximately 10 Minutes in a Severe Manner.  NAD Noted during Triage. A&Ox34. GCS 15. Ambulatory.

## 2022-06-06 NOTE — Discharge Instructions (Addendum)
We saw you in the ER for the chest pain/shortness of breath. All of our cardiac workup is normal, including labs, EKG and chest X-RAY are normal. Your chest pain did have some concerning features, therefore it is prudent that you follow-up with cardiology for outpatient work-up.  Start taking baby aspirin until cleared by your PCP or cardiology.  Please return to the ER if you have worsening chest pain, shortness of breath, pain radiating to your jaw, shoulder, or back, sweats or fainting. Otherwise see the Cardiologist or your primary care doctor as requested.

## 2022-06-06 NOTE — ED Provider Notes (Signed)
[facility] EMERGENCY DEPT Provider Note   CSN: [id number] Arrival date & time: 06/06/22  1121     History  Chief Complaint  Patient presents with   Chest Pain    Jessica Gilmore is a 51 y.o. female.  HPI    51 year old patient comes in with chief complaint of chest pain.  Patient indicates that last night, she woke up in the middle night because of chest pain.  Chest pain is described as heaviness and she also had associated back pain.  The episode lasted for 10 minutes and then resolved on its own.  She does not have any history of GERD.  She had associated shortness of breath.  Denies nausea and unsure if she was diaphoretic given her premenopausal state.  Patient has not had any repeat episodes since.  She has no medical history.  She denies any heavy smoking, tobacco use disorder and there is no family history of premature CAD.  She also does not have any history of GERD. Pt has no hx of PE, DVT and denies any exogenous hormone (testosterone / estrogen) use, long distance travels or surgery in the past 6 weeks, active cancer, recent immobilization.    Prior to Admission medications   Medication Sig Start Date End Date Taking? Authorizing Provider  aspirin 81 MG chewable tablet Chew 1 tablet (81 mg total) by mouth daily. 06/06/22  Yes Derwood Kaplan, MD  nitroGLYCERIN (NITRODUR - DOSED IN MG/24 HR) 0.2 mg/hr patch Apply 1/4 patch daily to tendon for tendonitis. 05/25/20   Rodolph Bong, MD      Allergies    Patient has no known allergies.    Review of Systems   Review of Systems  All other systems reviewed and are negative.   Physical Exam Updated Vital Signs BP (!) 166/97 (BP Location: Right Arm)   Pulse 82   Temp 97.8 F (36.6 C) (Oral)   Resp 19   Ht 5\' 2"  (1.575 m)   Wt 67.1 kg   SpO2 94%   BMI 27.06 kg/m  Physical Exam Vitals and nursing note reviewed.  Constitutional:      Appearance: She is well-developed.  HENT:     Head: Atraumatic.   Cardiovascular:     Rate and Rhythm: Normal rate.  Pulmonary:     Effort: Pulmonary effort is normal.     Breath sounds: Normal breath sounds.  Musculoskeletal:     Cervical back: Normal range of motion and neck supple.  Skin:    General: Skin is warm and dry.  Neurological:     Mental Status: She is alert and oriented to person, place, and time.     ED Results / Procedures / Treatments   Labs (all labs ordered are listed, but only abnormal results are displayed) Labs Reviewed  BASIC METABOLIC PANEL - Abnormal; Notable for the following components:      Result Value   Glucose, Bld 110 (*)    All other components within normal limits  CBC  HCG, SERUM, QUALITATIVE  LIPASE, BLOOD  HEPATIC FUNCTION PANEL  TROPONIN I (HIGH SENSITIVITY)  TROPONIN I (HIGH SENSITIVITY)    EKG EKG Interpretation  Date/Time:  Wednesday June 06 2022 11:31:11 EDT Ventricular Rate:  79 PR Interval:  118 QRS Duration: 78 QT Interval:  380 QTC Calculation: 435 R Axis:   59 Text Interpretation: Normal sinus rhythm Nonspecific ST abnormality Abnormal ECG No previous ECGs available No old tracing to compare Confirmed by 04-04-1995 [phone])  on 06/06/2022 2:17:29 PM  Radiology DG Chest 2 View  Result Date: 06/06/2022 CLINICAL DATA:  Chest pain EXAM: CHEST - 2 VIEW COMPARISON:  None Available. FINDINGS: The heart size and mediastinal contours are within normal limits. Bibasilar reticular pulmonary opacities and bronchial wall thickening. The visualized skeletal structures are unremarkable. IMPRESSION: Bibasilar reticular pulmonary opacities, which are nonspecific. Differential considerations include reactive airway disease, atelectasis, aspiration, or less likely atypical edema. Chronic fibrosis is also possible, however in the absence of priors, unable to differentiate between an acute versus chronic process. Electronically Signed   By: Jacob Moores M.D.   On: 06/06/2022 12:59     Procedures Procedures    Medications Ordered in ED Medications - No data to display  ED Course/ Medical Decision Making/ A&P                           Medical Decision Making Amount and/or Complexity of Data Reviewed Labs: ordered. Radiology: ordered.  Risk OTC drugs.   This patient presents to the ED with chief complaint(s) of chest pain with no concerning medical history.  Patient states that the chest pain radiating to her back woke her up from middle of her sleep and she had associated shortness of breath.  Symptoms lasted for at least 10 to 15 minutes, were severe.  She has no GERD history and the pain was not similar to GERD.   HEAR score is 1  The differential diagnosis includes: ACS syndrome Aortic dissection Myocarditis Pericarditis PE Pneumothorax Musculoskeletal pain PUD / Gastritis / Esophagitis Esophageal spasm  Patient initial work-up included basic blood work-up including high-sensitivity troponin and EKG.  Patient's hear score is 1, chest pain is more than 3 hours prior to ED arrival, therefore we will not order a repeat troponin.  Initial troponin is below the institutional cutoff for myocardial injury.  Patient did have some typical features with her chest pain, therefore we will advised that she follows up with cardiology for advanced imaging or provocative testing.  The patient appears reasonably screened and/or stabilized for discharge and I doubt any other medical condition or other [facility] requiring further screening, evaluation, or treatment in the ED at this time prior to discharge.   Results from the ER workup discussed with the patient face to face and all questions answered to the best of my ability. The patient is safe for discharge with strict return precautions.    Final Clinical Impression(s) / ED Diagnoses Final diagnoses:  Angina pectoris (HCC)    Rx / DC Orders ED Discharge Orders          Ordered    aspirin 81 MG chewable  tablet  Daily        06/06/22 1438    Ambulatory referral to Cardiology       Comments: If you have not heard from the Cardiology office within the next 72 hours please call [phone].   06/06/22 1439              Derwood Kaplan, MD 06/06/22 1507

## 2022-06-06 NOTE — ED Notes (Signed)
Dc instructions reviewed with patient. Patient voiced understanding. Dc with belongings.  °

## 2022-06-13 ENCOUNTER — Ambulatory Visit (HOSPITAL_BASED_OUTPATIENT_CLINIC_OR_DEPARTMENT_OTHER): Payer: 59 | Admitting: Cardiovascular Disease

## 2022-06-13 ENCOUNTER — Encounter (HOSPITAL_BASED_OUTPATIENT_CLINIC_OR_DEPARTMENT_OTHER): Payer: Self-pay | Admitting: Cardiovascular Disease

## 2022-06-13 VITALS — BP 150/88 | HR 65 | Ht 62.0 in | Wt 152.7 lb

## 2022-06-13 DIAGNOSIS — R0789 Other chest pain: Secondary | ICD-10-CM

## 2022-06-13 DIAGNOSIS — R030 Elevated blood-pressure reading, without diagnosis of hypertension: Secondary | ICD-10-CM | POA: Insufficient documentation

## 2022-06-13 NOTE — Patient Instructions (Addendum)
Medication Instructions:  CHANGE YOUR ASPIRIN TO ENTERIC COATED ASPIRIN 81 MG DAILY AND TAKE WITH FOOD   *If you need a refill on your cardiac medications before your next appointment, please call your pharmacy*  Lab Work: NONE  Testing/Procedures: CALCIUM SCORE - THIS WILL COST YOU $99 OUT OF POCKET   Follow-Up: At [facility], you and your health needs are our priority.  As part of our continuing mission to provide you with exceptional heart care, we have created designated Provider Care Teams.  These Care Teams include your primary Cardiologist (physician) and Advanced Practice Providers (APPs -  Physician Assistants and Nurse Practitioners) who all work together to provide you with the care you need, when you need it.  We recommend signing up for the patient portal called "MyChart".  Sign up information is provided on this After Visit Summary.  MyChart is used to connect with patients for Virtual Visits (Telemedicine).  Patients are able to view lab/test results, encounter notes, upcoming appointments, etc.  Non-urgent messages can be sent to your provider as well.   To learn more about what you can do with MyChart, go to [email or web address].    Your next appointment:   1 month(s)  The format for your next appointment:   In Person  Provider:   Gillian Shields, NP   Other Instructions  MONITOR AND LOG YOUR BLOOD PRESSURE ONE TO TWO TIMES A DAY BRING YOUR READINGS AND  MACHINE TO YOUR FOLLOW UP APPOINTMENT

## 2022-06-13 NOTE — Assessment & Plan Note (Signed)
BP was elevated in the office and at home.  She is going to track her BP at home and bring to follow up.

## 2022-06-13 NOTE — Assessment & Plan Note (Addendum)
Jessica Gilmore had an isolated episode of chest pain that awakened her from sleep.  She hasn't had any recurrent symptoms.  She ruled out for MI in the ED.  She hasn't had any recurrent symptoms, but also doesn't get any exercise.  We discussed option, and she isn't interested in a coronary CT.  She is OK with getting a calcium score.  OK to continue aspirin 81mg  for now.  Recommend that she transition to enteric coated.  Consider stopping it if she doesn't have significant disease. I suspect her episode was related to GERD.

## 2022-06-13 NOTE — Progress Notes (Signed)
Cardiology Office Note:    Date:  06/13/2022   ID:  Jessica Gilmore, DOB May 12, 1971, MRN [id number]  PCP:  Irena Reichmann, DO   [facility] Providers Cardiologist:  None     Referring MD: Derwood Kaplan, MD   No chief complaint on file.   History of Present Illness:    Jessica Gilmore is a 51 y.o. female with no pertinent cardiovascular hx here for the evaluation of angina pectoris. She was seen in the ED 06/06/2022 after waking up with chest heaviness, back pain, and shortness of breath the night before. This lasted for 10 minutes before spontaneously resolving and did not recur. Her EKG showed normal sinus rhythm at 79 bpm, nonspecific ST abnormality. HEAR score is 1. Initial troponin was below the institutional cutoff for myocardial injury. Since she did have some typical features with her chest pain, she was advised to follow up with cardiology for further evaluation.  Today, she is accompanied by her husband. She confirms that she was sleeping and woke up due to her chest pain. She sat on the bed for a time, then got up as it was also difficult to breathe. She walked around the room for a time and her pain slowly resolved on its own. She is not sure if she was feeling nauseous as well. At the time she was not diaphoretic or feeling clammy. Since that initial episode, her chest pain has not recurred. With certain foods she will develop acid reflux. She also believes her 81 mg ASA may be causing some of the acid reflux. She is taking a chewable aspirin. Usually if her blood pressure is high it would be caused by stress. Her normal at home blood pressure is closer to 133 systolic. Generally she does not formally exercise. To stay active she usually performs yard work without anginal symptoms. She does not smoke. Her alcohol consumption is very rare. Several weeks ago she noticed issues with weakness and pain in her arms and hands. She had followed up with her PCP. She denies any  palpitations, or peripheral edema. No lightheadedness, headaches, syncope, orthopnea, or PND.  Past Medical History:  Diagnosis Date   Atypical chest pain 06/13/2022   Elevated blood pressure reading 06/13/2022    History reviewed. No pertinent surgical history.  Current Medications: Current Meds  Medication Sig   aspirin EC 81 MG tablet Take 81 mg by mouth daily. TAKE WITH FOOD   [DISCONTINUED] aspirin 81 MG chewable tablet Chew 1 tablet (81 mg total) by mouth daily.     Allergies:   Patient has no known allergies.   Social History   Socioeconomic History   Marital status: Married    Spouse name: Not on file   Number of children: Not on file   Years of education: Not on file   Highest education level: Not on file  Occupational History   Not on file  Tobacco Use   Smoking status: Never   Smokeless tobacco: Never  Substance and Sexual Activity   Alcohol use: Yes    Comment: Occasionally   Drug use: Not Currently   Sexual activity: Not Currently  Other Topics Concern   Not on file  Social History Narrative   Not on file   Social Determinants of Health   Financial Resource Strain: Low Risk  (06/13/2022)   Overall Financial Resource Strain (CARDIA)    Difficulty of Paying Living Expenses: Not hard at all  Food Insecurity: No Food Insecurity (06/13/2022)   Hunger  Vital Sign    Worried About Programme researcher, broadcasting/film/video in the Last Year: Never true    Ran Out of Food in the Last Year: Never true  Transportation Needs: No Transportation Needs (06/13/2022)   PRAPARE - Administrator, Civil Service (Medical): No    Lack of Transportation (Non-Medical): No  Physical Activity: Inactive (06/13/2022)   Exercise Vital Sign    Days of Exercise per Week: 0 days    Minutes of Exercise per Session: 0 min  Stress: Not on file  Social Connections: Not on file     Family History: The patient's family history is not on file.  ROS:   Please see the history of present illness.     (+) Chest pain (+) Acid reflux All other systems reviewed and are negative.  EKGs/Labs/Other Studies Reviewed:    The following studies were reviewed today:  Chest X-Ray  06/06/2022: FINDINGS: The heart size and mediastinal contours are within normal limits.   Bibasilar reticular pulmonary opacities and bronchial wall thickening.   The visualized skeletal structures are unremarkable.   IMPRESSION: Bibasilar reticular pulmonary opacities, which are nonspecific. Differential considerations include reactive airway disease, atelectasis, aspiration, or less likely atypical edema. Chronic fibrosis is also possible, however in the absence of priors, unable to differentiate between an acute versus chronic process.   EKG:   EKG is personally reviewed. 06/13/2022: EKG was not ordered.  Recent Labs: 06/06/2022: ALT 21; BUN 11; Creatinine, Ser 0.71; Hemoglobin 14.2; Platelets 265; Potassium 3.8; Sodium 138   Recent Lipid Panel No results found for: "CHOL", "TRIG", "HDL", "CHOLHDL", "VLDL", "LDLCALC", "LDLDIRECT"   Risk Assessment/Calculations:           Physical Exam:    Wt Readings from Last 3 Encounters:  06/13/22 152 lb 11.2 oz (69.3 kg)  06/06/22 147 lb 14.9 oz (67.1 kg)  05/25/20 148 lb (67.1 kg)     VS:  BP (!) 150/88 (BP Location: Right Arm, Patient Position: Sitting, Cuff Size: Normal)   Pulse 65   Ht 5\' 2"  (1.575 m)   Wt 152 lb 11.2 oz (69.3 kg)   SpO2 95%   BMI 27.93 kg/m  , BMI Body mass index is 27.93 kg/m. GENERAL:  Well appearing HEENT: Pupils equal round and reactive, fundi not visualized, oral mucosa unremarkable NECK:  No jugular venous distention, waveform within normal limits, carotid upstroke brisk and symmetric, no bruits, no thyromegaly LUNGS:  Clear to auscultation bilaterally HEART:  RRR.  PMI not displaced or sustained,S1 and S2 within normal limits, no S3, no S4, no clicks, no rubs, no murmurs ABD:  Flat, positive bowel sounds normal in  frequency in pitch, no bruits, no rebound, no guarding, no midline pulsatile mass, no hepatomegaly, no splenomegaly EXT:  2 plus pulses throughout, no edema, no cyanosis no clubbing SKIN:  No rashes no nodules NEURO:  Cranial nerves II through XII grossly intact, motor grossly intact throughout PSYCH:  Cognitively intact, oriented to person place and time   ASSESSMENT:    1. Chest heaviness   2. Atypical chest pain   3. Elevated blood pressure reading    PLAN:    Atypical chest pain Ms. Denes had an isolated episode of chest pain that awakened her from sleep.  She hasn't had any recurrent symptoms.  She ruled out for MI in the ED.  She hasn't had any recurrent symptoms, but also doesn't get any exercise.  We discussed option, and she isn't interested in  a coronary CT.  She is OK with getting a calcium score.  OK to continue aspirin 81mg  for now.  Recommend that she transition to enteric coated.  Consider stopping it if she doesn't have significant disease. I suspect her episode was related to GERD.  Elevated blood pressure reading BP was elevated in the office and at home.  She is going to track her BP at home and bring to follow up.     Disposition: FU with APP in 1 month.  Medication Adjustments/Labs and Tests Ordered: Current medicines are reviewed at length with the patient today.  Concerns regarding medicines are outlined above.   Orders Placed This Encounter  Procedures   CT CARDIAC SCORING (SELF PAY ONLY)   No orders of the defined types were placed in this encounter.  Patient Instructions  Medication Instructions:  CHANGE YOUR ASPIRIN TO ENTERIC COATED ASPIRIN 81 MG DAILY AND TAKE WITH FOOD   *If you need a refill on your cardiac medications before your next appointment, please call your pharmacy*  Lab Work: NONE  Testing/Procedures: CALCIUM SCORE - THIS WILL COST YOU $99 OUT OF POCKET   Follow-Up: At [facility], you and your health needs are our  priority.  As part of our continuing mission to provide you with exceptional heart care, we have created designated Provider Care Teams.  These Care Teams include your primary Cardiologist (physician) and Advanced Practice Providers (APPs -  Physician Assistants and Nurse Practitioners) who all work together to provide you with the care you need, when you need it.  We recommend signing up for the patient portal called "MyChart".  Sign up information is provided on this After Visit Summary.  MyChart is used to connect with patients for Virtual Visits (Telemedicine).  Patients are able to view lab/test results, encounter notes, upcoming appointments, etc.  Non-urgent messages can be sent to your provider as well.   To learn more about what you can do with MyChart, go to [email or web address].    Your next appointment:   1 month(s)  The format for your next appointment:   In Person  Provider:   Laurann Montana, NP   Other Instructions  MONITOR AND LOG YOUR BLOOD PRESSURE ONE TO TWO TIMES A DAY [place]        I,Mathew Stumpf,acting as a scribe for Skeet Latch, MD.,have documented all relevant documentation on the behalf of Skeet Latch, MD,as directed by  Skeet Latch, MD while in the presence of Skeet Latch, MD.  I, [place] Oval Linsey, MD have reviewed all documentation for this visit.  The documentation of the exam, diagnosis, procedures, and orders on 06/13/2022 are all accurate and complete.   Signed, Skeet Latch, MD  06/13/2022 5:14 PM    [facility]

## 2022-07-11 ENCOUNTER — Ambulatory Visit (HOSPITAL_BASED_OUTPATIENT_CLINIC_OR_DEPARTMENT_OTHER)
Admission: RE | Admit: 2022-07-11 | Discharge: 2022-07-11 | Disposition: A | Discharge status: Home / Self Care | Payer: 59 | Source: Ambulatory Visit | Attending: Cardiovascular Disease | Admitting: Cardiovascular Disease

## 2022-07-11 ENCOUNTER — Encounter (HOSPITAL_BASED_OUTPATIENT_CLINIC_OR_DEPARTMENT_OTHER): Payer: Self-pay

## 2022-07-11 DIAGNOSIS — R0789 Other chest pain: Secondary | ICD-10-CM | POA: Insufficient documentation

## 2022-07-15 NOTE — Progress Notes (Signed)
Cardiology Office Note:    Date:  07/16/2022   ID:  Jessica Gilmore, DOB [birth year], MRN [id number]  PCP:  Janie Morning, [facility] Providers Cardiologist:  None     Referring MD: Janie Morning, DO   CC: Here for follow-up  History of Present Illness:    Jessica Gilmore is a 51 y.o. female with a hx of the following:  Atypical chest pain Elevated blood pressure reading Pulmonary nodules   No previous cardiovascular history until she was seen in the ED on 06/06/2022 for evaluation of chest heaviness. The night before, she woke up with chest pain that radiated to her back, and SHOB, lasted for 10 minutes and resolved without recurrence. EKG revealed NSR, 79 bpm, with nonspecific ST abnormality. 2 view CXR showed bibasilar reticular pulmonary nonspecific opacities, unable to differentiate between acute vs. chronic process. Initial troponin was below institutional cutoff for myocardial injury. Because her chest pain had some typical features, she was advised to follow-up with cardiology for more thorough evaluation. Last seen by [name] on 06/13/22. No more recurrent episodes since ED discharge. Believed ASA and certain foods were causing her acid reflux. Average SBP was 133. Denied official exercise but reported performing yard work, denied angina with this. Overall was doing well from a cardiac perspective. Deferred coronary CTA, but was agreeable to calcium score. Coronary calcium score was 0, was in 1st percentile for her demographics, scattered tiny bilateral pulmonary nodules measuring up to 3 mm were noted on CT.   Today she presents for follow-up.  Overall doing well from a cardiac perspective.  No more chest pain.  Said heartburn only happen 1 time and has not had any issues since.  Says she is doing well overall.  No family history of cardiovascular disease or high blood pressure that she is aware of.  She brings in her blood pressure log from home, average  systolic blood pressure readings are 130s to 140s.  She brings in BP machine from home, reading was 158/97.  BP on arrival 148/93.  Denies any chest pain, shortness of breath, palpitations, syncope, presyncope, dizziness, lightheadedness, orthopnea, PND, swelling, significant weight changes, bleeding, or claudication.   Past Medical History:  Diagnosis Date   Atypical chest pain 06/13/2022   Elevated blood pressure reading 06/13/2022    No past surgical history on file.  Current Medications: Current Meds  Medication Sig   aspirin EC 81 MG tablet Take 81 mg by mouth daily. TAKE WITH FOOD   Cholecalciferol (VITAMIN D) 125 MCG (5000 UT) CAPS Take 1 tablet by mouth daily.     Allergies:   Patient has no known allergies.   Social History   Socioeconomic History   Marital status: Married    Spouse name: Not on file   Number of children: Not on file   Years of education: Not on file   Highest education level: Not on file  Occupational History   Not on file  Tobacco Use   Smoking status: Never   Smokeless tobacco: Never  Substance and Sexual Activity   Alcohol use: Yes    Comment: Occasionally   Drug use: Not Currently   Sexual activity: Not Currently  Other Topics Concern   Not on file  Social History Narrative   Not on file   Social Determinants of Health   Financial Resource Strain: Low Risk  (06/13/2022)   Overall Financial Resource Strain (CARDIA)    Difficulty of Paying Living Expenses: Not  hard at all  Food Insecurity: No Food Insecurity (06/13/2022)   Hunger Vital Sign    Worried About Running Out of Food in the Last Year: Never true    Ran Out of Food in the Last Year: Never true  Transportation Needs: No Transportation Needs (06/13/2022)   PRAPARE - Hydrologist (Medical): No    Lack of Transportation (Non-Medical): No  Physical Activity: Inactive (06/13/2022)   Exercise Vital Sign    Days of Exercise per Week: 0 days    Minutes of  Exercise per Session: 0 min  Stress: Not on file  Social Connections: Not on file     ROS:   Review of Systems  Constitutional: Negative.   HENT: Negative.    Eyes: Negative.   Respiratory: Negative.    Cardiovascular: Negative.   Gastrointestinal: Negative.   Genitourinary: Negative.   Musculoskeletal: Negative.   Skin: Negative.   Neurological: Negative.   Endo/Heme/Allergies: Negative.   Psychiatric/Behavioral: Negative.      Please see the history of present illness.    All other systems reviewed and are negative.  EKGs/Labs/Other Studies Reviewed:    The following studies were reviewed today:   EKG:  EKG is not ordered today.   CT Cardiac Scoring on 07/11/2022: 1. Coronary calcium score of 0. This was 1st percentile for age, gender, and race matched controls. Scattered tiny bilateral pulmonary nodules measure up to 3 mm. No follow-up needed if patient is low-risk (and has no known or suspected primary neoplasm). Non-contrast chest CT can be considered in 12 months if patient is high-risk. This recommendation follows the consensus statement: Guidelines for Management of Incidental Pulmonary Nodules Detected on CT Images: From the Fleischner Society 2017; Radiology 2017; 284:228-243.  Recent Labs: 06/06/2022: ALT 21; BUN 11; Creatinine, Ser 0.71; Hemoglobin 14.2; Platelets 265; Potassium 3.8; Sodium 138  Recent Lipid Panel No results found for: "CHOL", "TRIG", "HDL", "CHOLHDL", "VLDL", "LDLCALC", "LDLDIRECT"   Physical Exam:    VS:  BP 139/82 (BP Location: Right Arm, Patient Position: Sitting, Cuff Size: Normal)   Pulse 70   Ht 5\' 2"  (1.575 m)   Wt 152 lb 3.2 oz (69 kg)   BMI 27.84 kg/m     Wt Readings from Last 3 Encounters:  07/16/22 152 lb 3.2 oz (69 kg)  06/13/22 152 lb 11.2 oz (69.3 kg)  06/06/22 147 lb 14.9 oz (67.1 kg)    Vitals:   07/16/22 0828 07/16/22 0830  BP: (!) 158/97 139/82  Pulse:       GEN: Well nourished, well developed in no  acute distress HEENT: Normal NECK: No JVD; No carotid bruits CARDIAC: RRR, no murmurs, rubs, gallop; 2+ peripheral pulses throughout, strong and equal bilaterally RESPIRATORY:  Clear and diminished to auscultation without rales, wheezing or rhonchi  MUSCULOSKELETAL:  No edema; No deformity  SKIN: Warm and dry NEUROLOGIC:  Alert and oriented x 3 PSYCHIATRIC:  Normal affect   ASSESSMENT:    1. Elevated blood pressure reading   2. Atypical chest pain   3. Pulmonary nodules    PLAN:    In order of problems listed above:  Elevated blood pressure readings BP on arrival 148/93.  BP on home monitor 158/97.  Repeat manual, 139/82.  BP log reveals average SBP 130s to 140s.  Current SBP is not at goal.  She would like to initiate lifestyle changes including diet and exercise, and will send me BP reading in 1 month via MyChart.  If SBP is not at goal, plan to initiate Amlodipine 2.5 mg daily. BP log given today. Low salt diet recommended.   Atypical chest pain No recurrent chest pain.  CT cardiac scoring revealed coronary calcium score of 0.  Continue aspirin 81 mg EC daily.   Pulmonary nodules Incidental finding noted on CT cardiac scoring, scattered tiny bilateral pulmonary nodules measuring up to 3 mm.  Recommend noncontrast chest CT in 1 year for monitoring. Continue to follow with PCP.    4. Disposition: Follow-up with [name] in 4-6 months or sooner if anything changes.    Medication Adjustments/Labs and Tests Ordered: Current medicines are reviewed at length with the patient today.  Concerns regarding medicines are outlined above.  No orders of the defined types were placed in this encounter.  No orders of the defined types were placed in this encounter.   Patient Instructions  Medication Instructions:  Your Physician recommend you continue on your current medication as directed.    Please check your blood pressure 1x/ day after resting about 5-10 minutes, track on log given  today, and Kaila will send you a mychart message in about a month to check on those blood pressures.   *If you need a refill on your cardiac medications before your next appointment, please call your pharmacy*  Follow-Up: At [facility], you and your health needs are our priority.  As part of our continuing mission to provide you with exceptional heart care, we have created designated Provider Care Teams.  These Care Teams include your primary Cardiologist (physician) and Advanced Practice Providers (APPs -  Physician Assistants and Nurse Practitioners) who all work together to provide you with the care you need, when you need it.  We recommend signing up for the patient portal called "MyChart".  Sign up information is provided on this After Visit Summary.  MyChart is used to connect with patients for Virtual Visits (Telemedicine).  Patients are able to view lab/test results, encounter notes, upcoming appointments, etc.  Non-urgent messages can be sent to your provider as well.   To learn more about what you can do with MyChart, go to [email or web address].    Your next appointment:   4-6 months in person with [name]   Other Instructions Heart Healthy Diet Recommendations: A low-salt diet is recommended. Meats should be grilled, baked, or boiled. Avoid fried foods. Focus on lean protein sources like fish or chicken with vegetables and fruits. The American Heart Association is a Microbiologist!  American Heart Association Diet and Lifeystyle Recommendations   Exercise recommendations: The American Heart Association recommends 150 minutes of moderate intensity exercise weekly. Try 30 minutes of moderate intensity exercise 4-5 times per week. This could include walking, jogging, or swimming.   Important Information About Sugar         Signed, Finis Bud, NP  07/16/2022 8:52 AM    [place]

## 2022-07-16 ENCOUNTER — Encounter (HOSPITAL_BASED_OUTPATIENT_CLINIC_OR_DEPARTMENT_OTHER): Payer: Self-pay | Admitting: Nurse Practitioner

## 2022-07-16 ENCOUNTER — Ambulatory Visit (HOSPITAL_BASED_OUTPATIENT_CLINIC_OR_DEPARTMENT_OTHER): Payer: 59 | Admitting: Nurse Practitioner

## 2022-07-16 VITALS — BP 139/82 | HR 70 | Ht 62.0 in | Wt 152.2 lb

## 2022-07-16 DIAGNOSIS — R918 Other nonspecific abnormal finding of lung field: Secondary | ICD-10-CM | POA: Insufficient documentation

## 2022-07-16 DIAGNOSIS — R0789 Other chest pain: Secondary | ICD-10-CM

## 2022-07-16 DIAGNOSIS — R030 Elevated blood-pressure reading, without diagnosis of hypertension: Secondary | ICD-10-CM

## 2022-07-16 NOTE — Patient Instructions (Signed)
Medication Instructions:  Your Physician recommend you continue on your current medication as directed.    Please check your blood pressure 1x/ day after resting about 5-10 minutes, track on log given today, and Jessica Gilmore will send you a mychart message in about a month to check on those blood pressures.   *If you need a refill on your cardiac medications before your next appointment, please call your pharmacy*  Follow-Up: At [facility], you and your health needs are our priority.  As part of our continuing mission to provide you with exceptional heart care, we have created designated Provider Care Teams.  These Care Teams include your primary Cardiologist (physician) and Advanced Practice Providers (APPs -  Physician Assistants and Nurse Practitioners) who all work together to provide you with the care you need, when you need it.  We recommend signing up for the patient portal called "MyChart".  Sign up information is provided on this After Visit Summary.  MyChart is used to connect with patients for Virtual Visits (Telemedicine).  Patients are able to view lab/test results, encounter notes, upcoming appointments, etc.  Non-urgent messages can be sent to your provider as well.   To learn more about what you can do with MyChart, go to [email or web address].    Your next appointment:   4-6 months in person with [name]   Other Instructions Heart Healthy Diet Recommendations: A low-salt diet is recommended. Meats should be grilled, baked, or boiled. Avoid fried foods. Focus on lean protein sources like fish or chicken with vegetables and fruits. The American Heart Association is a Microbiologist!  American Heart Association Diet and Lifeystyle Recommendations   Exercise recommendations: The American Heart Association recommends 150 minutes of moderate intensity exercise weekly. Try 30 minutes of moderate intensity exercise 4-5 times per week. This could include walking, jogging, or  swimming.   Important Information About Sugar

## 2022-08-13 ENCOUNTER — Telehealth (HOSPITAL_BASED_OUTPATIENT_CLINIC_OR_DEPARTMENT_OTHER): Payer: Self-pay

## 2022-08-13 NOTE — Telephone Encounter (Addendum)
Called patient to get blood pressure log. Patient states that they are getting a little better. Patient completley cut out her Caffeine.   128/85 130/82 134/90 127/78 127/85 134/85 127/83   ----- Message from Gerald Stabs, RN sent at 07/16/2022  8:48 AM EDT ----- BP CHECK FOR EP

## 2022-08-14 NOTE — Telephone Encounter (Signed)
Returned call to patient with the following message, patient very appreciative of the call.     "Thanks Jessica Gilmore! Her BP log is great. At this time, no changes in the plan of care. Continue lifestyle changes at this time.   Thanks!   Kind Regards,  Finis Bud, NP "

## 2022-11-06 DIAGNOSIS — M25511 Pain in right shoulder: Secondary | ICD-10-CM | POA: Diagnosis not present

## 2022-11-06 DIAGNOSIS — M545 Low back pain, unspecified: Secondary | ICD-10-CM | POA: Diagnosis not present

## 2022-11-06 DIAGNOSIS — R200 Anesthesia of skin: Secondary | ICD-10-CM | POA: Diagnosis not present

## 2022-11-06 DIAGNOSIS — M25512 Pain in left shoulder: Secondary | ICD-10-CM | POA: Diagnosis not present

## 2022-11-06 DIAGNOSIS — M542 Cervicalgia: Secondary | ICD-10-CM | POA: Diagnosis not present

## 2022-11-20 DIAGNOSIS — M79601 Pain in right arm: Secondary | ICD-10-CM | POA: Diagnosis not present

## 2022-11-20 DIAGNOSIS — G5601 Carpal tunnel syndrome, right upper limb: Secondary | ICD-10-CM | POA: Diagnosis not present

## 2022-11-20 DIAGNOSIS — M79602 Pain in left arm: Secondary | ICD-10-CM | POA: Diagnosis not present

## 2023-01-10 ENCOUNTER — Ambulatory Visit (HOSPITAL_BASED_OUTPATIENT_CLINIC_OR_DEPARTMENT_OTHER): Payer: 59 | Admitting: Cardiovascular Disease

## 2023-01-10 NOTE — Progress Notes (Incomplete)
Cardiology Office Note:    Date:  01/10/2023   ID:  Jessica Gilmore, DOB 1971/02/04, MRN [id number]  PCP:  Janie Morning, DO   [facility] Providers Cardiologist:  None     Referring MD: Janie Morning, DO   No chief complaint on file.   History of Present Illness:    Jessica Gilmore is a 52 y.o. female with hypertension here for follow up. She was first seen 06/2022 after presenting to the ED with chest pain. Her chest pain was very atypical. She had a calcium score 06/2022 that was 0. Ultimately her chest pain was due to GERD. She was noted to have hypertension and started on amlodipine. She followed with Finis Bud, NP and her blood pressure remained uncontrolled. She had not statred amlodipine and wanted to keep working on diet and exercise. She called the office 07/2022 and her blood pressures were averaging in the 120's to 130's over 70's to 90's.  Today, the patient states that she  She denies any palpitations, chest pain, shortness of breath, or peripheral edema. No lightheadedness, headaches, syncope, orthopnea, or PND.  (+)  Past Medical History:  Diagnosis Date   Atypical chest pain 06/13/2022   Elevated blood pressure reading 06/13/2022    No past surgical history on file.  Current Medications: No outpatient medications have been marked as taking for the 01/10/23 encounter (Appointment) with Skeet Latch, MD.     Allergies:   Patient has no known allergies.   Social History   Socioeconomic History   Marital status: Married    Spouse name: Not on file   Number of children: Not on file   Years of education: Not on file   Highest education level: Not on file  Occupational History   Not on file  Tobacco Use   Smoking status: Never   Smokeless tobacco: Never  Substance and Sexual Activity   Alcohol use: Yes    Comment: Occasionally   Drug use: Not Currently   Sexual activity: Not Currently  Other Topics Concern   Not on file  Social History  Narrative   Not on file   Social Determinants of Health   Financial Resource Strain: Low Risk  (06/13/2022)   Overall Financial Resource Strain (CARDIA)    Difficulty of Paying Living Expenses: Not hard at all  Food Insecurity: No Food Insecurity (06/13/2022)   Hunger Vital Sign    Worried About Running Out of Food in the Last Year: Never true    [place] in the Last Year: Never true  Transportation Needs: No Transportation Needs (06/13/2022)   PRAPARE - Hydrologist (Medical): No    Lack of Transportation (Non-Medical): No  Physical Activity: Inactive (06/13/2022)   Exercise Vital Sign    Days of Exercise per Week: 0 days    Minutes of Exercise per Session: 0 min  Stress: Not on file  Social Connections: Not on file     Family History: The patient's family history is not on file.  ROS:   Please see the history of present illness.     All other systems reviewed and are negative.  EKGs/Labs/Other Studies Reviewed:    The following studies were reviewed today:  Chest X-Ray  06/06/2022: FINDINGS: The heart size and mediastinal contours are within normal limits.   Bibasilar reticular pulmonary opacities and bronchial wall thickening.   The visualized skeletal structures are unremarkable.   IMPRESSION: Bibasilar reticular pulmonary opacities, which are  nonspecific. Differential considerations include reactive airway disease, atelectasis, aspiration, or less likely atypical edema. Chronic fibrosis is also possible, however in the absence of priors, unable to differentiate between an acute versus chronic process.   EKG:   EKG is personally reviewed. 01/10/2023: *** 06/13/2022: EKG was not ordered.  Recent Labs: 06/06/2022: ALT 21; BUN 11; Creatinine, Ser 0.71; Hemoglobin 14.2; Platelets 265; Potassium 3.8; Sodium 138   Recent Lipid Panel No results found for: "CHOL", "TRIG", "HDL", "CHOLHDL", "VLDL", "LDLCALC", "LDLDIRECT"   Risk  Assessment/Calculations:           Physical Exam:    Wt Readings from Last 3 Encounters:  07/16/22 152 lb 3.2 oz (69 kg)  06/13/22 152 lb 11.2 oz (69.3 kg)  06/06/22 147 lb 14.9 oz (67.1 kg)     VS:  There were no vitals taken for this visit. , BMI There is no height or weight on file to calculate BMI. GENERAL:  Well appearing HEENT: Pupils equal round and reactive, fundi not visualized, oral mucosa unremarkable NECK:  No jugular venous distention, waveform within normal limits, carotid upstroke brisk and symmetric, no bruits, no thyromegaly LUNGS:  Clear to auscultation bilaterally HEART:  RRR.  PMI not displaced or sustained,S1 and S2 within normal limits, no S3, no S4, no clicks, no rubs, no murmurs ABD:  Flat, positive bowel sounds normal in frequency in pitch, no bruits, no rebound, no guarding, no midline pulsatile mass, no hepatomegaly, no splenomegaly EXT:  2 plus pulses throughout, no edema, no cyanosis no clubbing SKIN:  No rashes no nodules NEURO:  Cranial nerves II through XII grossly intact, motor grossly intact throughout PSYCH:  Cognitively intact, oriented to person place and time   ASSESSMENT:    No diagnosis found.  PLAN:    No problem-specific Assessment & Plan notes found for this encounter.   Disposition: FU with APP in 1 month.  Medication Adjustments/Labs and Tests Ordered: Current medicines are reviewed at length with the patient today.  Concerns regarding medicines are outlined above.   No orders of the defined types were placed in this encounter.  No orders of the defined types were placed in this encounter.  There are no Patient Instructions on file for this visit.   I,Coren O'Brien,acting as a Education administrator for [place], MD.,have documented all relevant documentation on the behalf of Skeet Latch, MD,as directed by  Skeet Latch, MD while in the presence of Skeet Latch, MD.  ***

## 2023-02-18 DIAGNOSIS — R7309 Other abnormal glucose: Secondary | ICD-10-CM | POA: Diagnosis not present

## 2023-02-18 DIAGNOSIS — Z0000 Encounter for general adult medical examination without abnormal findings: Secondary | ICD-10-CM | POA: Diagnosis not present

## 2023-02-18 DIAGNOSIS — R946 Abnormal results of thyroid function studies: Secondary | ICD-10-CM | POA: Diagnosis not present

## 2023-02-18 DIAGNOSIS — E7800 Pure hypercholesterolemia, unspecified: Secondary | ICD-10-CM | POA: Diagnosis not present

## 2023-02-25 DIAGNOSIS — R7309 Other abnormal glucose: Secondary | ICD-10-CM | POA: Diagnosis not present

## 2023-02-25 DIAGNOSIS — R946 Abnormal results of thyroid function studies: Secondary | ICD-10-CM | POA: Diagnosis not present

## 2023-02-25 DIAGNOSIS — Z79899 Other long term (current) drug therapy: Secondary | ICD-10-CM | POA: Diagnosis not present

## 2023-02-25 DIAGNOSIS — E7800 Pure hypercholesterolemia, unspecified: Secondary | ICD-10-CM | POA: Diagnosis not present

## 2023-02-25 DIAGNOSIS — Z0000 Encounter for general adult medical examination without abnormal findings: Secondary | ICD-10-CM | POA: Diagnosis not present

## 2023-04-12 DIAGNOSIS — Z01419 Encounter for gynecological examination (general) (routine) without abnormal findings: Secondary | ICD-10-CM | POA: Diagnosis not present

## 2023-04-12 DIAGNOSIS — Z1231 Encounter for screening mammogram for malignant neoplasm of breast: Secondary | ICD-10-CM | POA: Diagnosis not present

## 2023-04-12 DIAGNOSIS — Z6828 Body mass index (BMI) 28.0-28.9, adult: Secondary | ICD-10-CM | POA: Diagnosis not present

## 2023-04-17 ENCOUNTER — Other Ambulatory Visit: Payer: Self-pay | Admitting: Obstetrics and Gynecology

## 2023-04-17 DIAGNOSIS — R928 Other abnormal and inconclusive findings on diagnostic imaging of breast: Secondary | ICD-10-CM

## 2023-04-18 DIAGNOSIS — S61217A Laceration without foreign body of left little finger without damage to nail, initial encounter: Secondary | ICD-10-CM | POA: Diagnosis not present

## 2023-04-18 DIAGNOSIS — Z23 Encounter for immunization: Secondary | ICD-10-CM | POA: Diagnosis not present

## 2023-04-18 DIAGNOSIS — X58XXXA Exposure to other specified factors, initial encounter: Secondary | ICD-10-CM | POA: Diagnosis not present

## 2023-04-26 ENCOUNTER — Ambulatory Visit
Admission: RE | Admit: 2023-04-26 | Discharge: 2023-04-26 | Disposition: A | Discharge status: Home / Self Care | Payer: 59 | Source: Ambulatory Visit | Attending: Obstetrics and Gynecology | Admitting: Obstetrics and Gynecology

## 2023-04-26 DIAGNOSIS — Z4802 Encounter for removal of sutures: Secondary | ICD-10-CM | POA: Diagnosis not present

## 2023-04-26 DIAGNOSIS — R928 Other abnormal and inconclusive findings on diagnostic imaging of breast: Secondary | ICD-10-CM

## 2023-04-26 DIAGNOSIS — N6012 Diffuse cystic mastopathy of left breast: Secondary | ICD-10-CM | POA: Diagnosis not present

## 2023-04-29 ENCOUNTER — Other Ambulatory Visit: Payer: Self-pay | Admitting: Obstetrics and Gynecology

## 2023-04-29 DIAGNOSIS — N6320 Unspecified lump in the left breast, unspecified quadrant: Secondary | ICD-10-CM

## 2023-10-29 ENCOUNTER — Ambulatory Visit
Admission: RE | Admit: 2023-10-29 | Discharge: 2023-10-29 | Disposition: A | Discharge status: Home / Self Care | Payer: 59 | Source: Ambulatory Visit | Attending: Obstetrics and Gynecology | Admitting: Obstetrics and Gynecology

## 2023-10-29 ENCOUNTER — Other Ambulatory Visit: Payer: Self-pay | Admitting: Obstetrics and Gynecology

## 2023-10-29 DIAGNOSIS — N6320 Unspecified lump in the left breast, unspecified quadrant: Secondary | ICD-10-CM

## 2023-10-29 DIAGNOSIS — N6012 Diffuse cystic mastopathy of left breast: Secondary | ICD-10-CM | POA: Diagnosis not present

## 2024-04-21 DIAGNOSIS — E559 Vitamin D deficiency, unspecified: Secondary | ICD-10-CM | POA: Diagnosis not present

## 2024-04-21 DIAGNOSIS — Z01419 Encounter for gynecological examination (general) (routine) without abnormal findings: Secondary | ICD-10-CM | POA: Diagnosis not present

## 2024-04-21 DIAGNOSIS — Z6828 Body mass index (BMI) 28.0-28.9, adult: Secondary | ICD-10-CM | POA: Diagnosis not present

## 2024-04-27 DIAGNOSIS — Z13820 Encounter for screening for osteoporosis: Secondary | ICD-10-CM | POA: Diagnosis not present

## 2024-04-28 ENCOUNTER — Ambulatory Visit
Admission: RE | Admit: 2024-04-28 | Discharge: 2024-04-28 | Disposition: A | Discharge status: Home / Self Care | Source: Ambulatory Visit | Attending: Obstetrics and Gynecology | Admitting: Obstetrics and Gynecology

## 2024-04-28 DIAGNOSIS — N6320 Unspecified lump in the left breast, unspecified quadrant: Secondary | ICD-10-CM

## 2024-04-28 DIAGNOSIS — N6325 Unspecified lump in the left breast, overlapping quadrants: Secondary | ICD-10-CM | POA: Diagnosis not present

## 2024-07-31 DIAGNOSIS — M2021 Hallux rigidus, right foot: Secondary | ICD-10-CM | POA: Diagnosis not present

## 2024-07-31 DIAGNOSIS — M79671 Pain in right foot: Secondary | ICD-10-CM | POA: Diagnosis not present

## 2024-07-31 DIAGNOSIS — M79672 Pain in left foot: Secondary | ICD-10-CM | POA: Diagnosis not present

## 2024-07-31 DIAGNOSIS — M25562 Pain in left knee: Secondary | ICD-10-CM | POA: Diagnosis not present
